# Patient Record
Sex: Male | Born: 1990 | Hispanic: Yes | Marital: Single
Health system: Southern US, Community
[De-identification: ages and names within clinical notes are randomized; demographics above are authoritative.]

---

## 2022-04-21 ENCOUNTER — Emergency Department (HOSPITAL_COMMUNITY): Payer: Managed Care, Other (non HMO)

## 2022-04-21 ENCOUNTER — Encounter (HOSPITAL_COMMUNITY): Payer: Self-pay

## 2022-04-21 ENCOUNTER — Other Ambulatory Visit: Payer: Self-pay

## 2022-04-21 ENCOUNTER — Emergency Department (HOSPITAL_COMMUNITY)
Admission: EM | Admit: 2022-04-21 | Discharge: 2022-04-21 | Disposition: A | Discharge status: Home / Self Care | Payer: Managed Care, Other (non HMO) | Attending: Emergency Medicine | Admitting: Emergency Medicine

## 2022-04-21 DIAGNOSIS — R202 Paresthesia of skin: Secondary | ICD-10-CM | POA: Diagnosis not present

## 2022-04-21 DIAGNOSIS — R030 Elevated blood-pressure reading, without diagnosis of hypertension: Secondary | ICD-10-CM

## 2022-04-21 DIAGNOSIS — R519 Headache, unspecified: Secondary | ICD-10-CM | POA: Diagnosis present

## 2022-04-21 DIAGNOSIS — I10 Essential (primary) hypertension: Secondary | ICD-10-CM | POA: Insufficient documentation

## 2022-04-21 DIAGNOSIS — R072 Precordial pain: Secondary | ICD-10-CM

## 2022-04-21 LAB — TROPONIN I (HIGH SENSITIVITY)
Troponin I (High Sensitivity): 3 ng/L (ref <18)
Troponin I (High Sensitivity): 3 ng/L (ref <18)

## 2022-04-21 LAB — BASIC METABOLIC PANEL
Anion gap: 5 (ref 5–15)
BUN: 16 mg/dL (ref 6–20)
CO2: 28 mmol/L (ref 22–32)
Calcium: 9.4 mg/dL (ref 8.9–10.3)
Chloride: 109 mmol/L (ref 98–111)
Creatinine, Ser: 0.94 mg/dL (ref 0.61–1.24)
GFR, Estimated: >60 mL/min (ref >60)
Glucose, Bld: 95 mg/dL (ref 70–99)
Potassium: 4.3 mmol/L (ref 3.5–5.1)
Sodium: 142 mmol/L (ref 135–145)

## 2022-04-21 LAB — CBC
HCT: 46 % (ref 39.0–52.0)
Hemoglobin: 16.1 g/dL (ref 13.0–17.0)
MCH: 31 pg (ref 26.0–34.0)
MCHC: 35 g/dL (ref 30.0–36.0)
MCV: 88.5 fL (ref 80.0–100.0)
Platelets: 211 10*3/uL (ref 150–400)
RBC: 5.2 MIL/uL (ref 4.22–5.81)
RDW: 11.7 % (ref 11.5–15.5)
WBC: 8.6 10*3/uL (ref 4.0–10.5)
nRBC: 0 % (ref 0.0–0.2)

## 2022-04-21 NOTE — ED Triage Notes (Signed)
Pt states that he has been recording his blood pressure for 2 weeks and today he started having chest pain, headaches, and hand tingling, along with an elevated blood pressure. Pt reports being stressed out recently due to not having a vehicle.

## 2022-04-21 NOTE — Discharge Instructions (Signed)
Go to     [email or web address]    to find a primary care provider  Return for new or worsening symptoms

## 2022-04-21 NOTE — ED Provider Triage Note (Signed)
Emergency Medicine Provider Triage Evaluation Note  Jesse Hooper , a 31 y.o. male  was evaluated in triage.  Pt complains of elevated blood pressure.  States he has been monitoring his blood pressure over the last 2 weeks, as his family has history of hypertension.  He has no formal diagnosis of hypertension.  States this morning he developed some mild chest tightness, tingling all over, headache.  No vision changes.  No back pain, weakness, facial droop.  He does not have any chest pain performing his ADLs at baseline.  No lower extremity swelling, pain, history of PE or DVT  Review of Systems  Positive: Headache, chest pain, elevated blood pressure Negative: Vision changes, weakness, facial droop, slurred speech  Physical Exam  BP 140/89   Pulse 62   Temp 97.9 F (36.6 C) (Oral)   Resp 18   Ht 5\' 10"  (1.778 m)   Wt 75.8 kg   SpO2 100%   BMI 23.96 kg/m  Gen:   Awake, no distress   Resp:  Normal effort  MSK:   Moves extremities without difficulty  Neuro:  Cn 2-12 grossly intact, equal strength, intact sensation Other:    Medical Decision Making  Medically screening exam initiated at 7:58 PM.  Appropriate orders placed.  Larron Ernzen was informed that the remainder of the evaluation will be completed by another provider, this initial triage assessment does not replace that evaluation, and the importance of remaining in the ED until their evaluation is complete.  Elevated BP   Nargis Abrams A, PA-C 04/21/22 2000

## 2022-04-21 NOTE — ED Provider Notes (Signed)
[facility]   CSN: [id number] Arrival date & time: 04/21/22  1912     History  Chief Complaint  Patient presents with   Chest Pain    Jesse ErikssonRoberto Espinosa Hooper is a 31 y.o. male with past medical here for evaluation of elevated blood pressure.  States he is not monitoring his blood pressure over the last 2 weeks.  Has had intermittent elevation.  States he did this because his family members were recently diagnosed with hypertension.  He does not have a PCP.  No formal diagnosis of hypertension.  States he has had some intermittent headaches.  No vision changes, weakness, difficulty word finding.  Patient states he was anxious earlier today developed chest tightness and tingling all over.  Symptoms currently resolved.  He currently has no pain.  Does not get chest pain performing his typical ADLs.  No PND, orthopnea, lower extremity swelling.  No history of PE or DVT.  No illicit substance use.  HPI     Home Medications Prior to Admission medications   Not on File      Allergies    Aspirin    Review of Systems   Review of Systems  Constitutional: Negative.   HENT: Negative.    Respiratory: Negative.    Cardiovascular:  Positive for chest pain. Negative for palpitations and leg swelling.  Gastrointestinal: Negative.   Genitourinary: Negative.   Musculoskeletal: Negative.   Skin: Negative.   Neurological:  Positive for headaches. Negative for dizziness, tremors, seizures, syncope, facial asymmetry, speech difficulty, light-headedness and numbness.  All other systems reviewed and are negative.  Physical Exam Updated Vital Signs BP (!) 129/98   Pulse (!) 58   Temp 97.9 F (36.6 C) (Oral)   Resp (!) 21   Ht 5\' 10"  (1.778 m)   Wt 75.8 kg   SpO2 100%   BMI 23.96 kg/m  Physical Exam Vitals and nursing note reviewed.  Constitutional:      General: He is not in acute distress.    Appearance: He is well-developed. He is not  ill-appearing, toxic-appearing or diaphoretic.  HENT:     Head: Normocephalic and atraumatic.  Eyes:     Pupils: Pupils are equal, round, and reactive to light.  Cardiovascular:     Rate and Rhythm: Normal rate and regular rhythm.     Pulses:          Radial pulses are 2+ on the right side and 2+ on the left side.       Dorsalis pedis pulses are 2+ on the right side and 2+ on the left side.     Heart sounds: Normal heart sounds.  Pulmonary:     Effort: Pulmonary effort is normal. No respiratory distress.     Breath sounds: Normal breath sounds.  Abdominal:     General: Bowel sounds are normal. There is no distension.     Palpations: Abdomen is soft.  Musculoskeletal:        General: Normal range of motion.     Cervical back: Normal range of motion and neck supple.     Right lower leg: No tenderness. No edema.     Left lower leg: No tenderness. No edema.  Skin:    General: Skin is warm and dry.  Neurological:     General: No focal deficit present.     Mental Status: He is alert and oriented to person, place, and time.     Cranial Nerves:  No cranial nerve deficit.     Sensory: Sensation is intact.     Motor: Motor function is intact. No weakness.     Coordination: Coordination is intact.     Gait: Gait is intact.     Comments: Cn 2-12 grossly intact Equal strength Intact sensation Ambulatory without ataxic gait    ED Results / Procedures / Treatments   Labs (all labs ordered are listed, but only abnormal results are displayed) Labs Reviewed  BASIC METABOLIC PANEL  CBC  TROPONIN I (HIGH SENSITIVITY)  TROPONIN I (HIGH SENSITIVITY)    EKG EKG Interpretation  Date/Time:  Tuesday April 21 2022 20:18:30 EDT Ventricular Rate:  52 PR Interval:  165 QRS Duration: 103 QT Interval:  399 QTC Calculation: 371 R Axis:   72 Text Interpretation: Sinus rhythm ST elevation suggests acute pericarditis since last tracing no significant change Confirmed by Mancel Bale [phone]) on  04/21/2022 11:04:59 PM  Radiology DG Chest 2 View  Result Date: 04/21/2022 CLINICAL DATA:  Chest pain EXAM: CHEST - 2 VIEW COMPARISON:  None Available. FINDINGS: Cardiac and mediastinal contours are within normal limits. No focal pulmonary opacity. No pleural effusion or pneumothorax. No acute osseous abnormality. IMPRESSION: No acute cardiopulmonary process. Electronically Signed   By: Wiliam Ke M.D.   On: 04/21/2022 19:53   CT HEAD WO CONTRAST ( )  Result Date: 04/21/2022 CLINICAL DATA:  Chest pain, headache, high blood pressure EXAM: CT HEAD WITHOUT CONTRAST TECHNIQUE: Contiguous axial images were obtained from the base of the skull through the vertex without intravenous contrast. RADIATION DOSE REDUCTION: This exam was performed according to the departmental dose-optimization program which includes automated exposure control, adjustment of the mA and/or kV according to patient size and/or use of iterative reconstruction technique. COMPARISON:  None Available. FINDINGS: Brain: No evidence of acute infarction, hemorrhage, cerebral edema, mass, mass effect, or midline shift. No hydrocephalus or extra-axial fluid collection. Vascular: No hyperdense vessel. Skull: Normal. Negative for fracture or focal lesion. Sinuses/Orbits: No acute finding. Other: The mastoid air cells are well aerated. IMPRESSION: No acute intracranial process. Electronically Signed   By: Wiliam Ke M.D.   On: 04/21/2022 19:59    Procedures Procedures    Medications Ordered in ED Medications - No data to display  ED Course/ Medical Decision Making/ A&P    31 year old no chronic medical problems here for evaluation of elevated blood pressure.  Has been checking his blood pressure at home due to family members recently being diagnosed with hypertension.  States few days ago he had a general aching headache.  None currently.  No head trauma.  He has a nonfocal neuro exam without deficits.  Patient states earlier today he  got worked up and developed some chest tightness and tingling all over.  Resolved prior to arrival.  Does not typically get chest pain with exertion.  Does not appear grossly fluid overloaded.  He is PERC negative, Wells criteria low risk.  Labs and imaging personally viewed and interpreted:  CBC without leukocytosis Delta troponin flat Metabolic panel without significant abnormality EKG initially read possible pericarditis however symptoms do not seem consistent with this.  No STEMI DG chest without any cardiomegaly, pulm edema, infiltrate, pneumothorax CT head without significant normality  Patient reassessed.  Continues to be symptom-free.  Low suspicion for acute ACS, PE, dissection, CVA, hypertensive urgency or emergency.  Sx do not seem consistent myocarditis, pericarditis.  Discussed keeping log of blood pressures at home as well as following up with PCP.  He was given resources for both.  Encouraged return for new or worsening symptoms.  The patient has been appropriately medically screened and/or stabilized in the ED. I have low suspicion for any other emergent medical condition which would require further screening, evaluation or treatment in the ED or require inpatient management.  Patient is hemodynamically stable and in no acute distress.  Patient able to ambulate in department prior to ED.  Evaluation does not show acute pathology that would require ongoing or additional emergent interventions while in the emergency department or further inpatient treatment.  I have discussed the diagnosis with the patient and answered all questions.  Pain is been managed while in the emergency department and patient has no further complaints prior to discharge.  Patient is comfortable with plan discussed in room and is stable for discharge at this time.  I have discussed strict return precautions for returning to the emergency department.  Patient was encouraged to follow-up with PCP/specialist refer  to at discharge.                            Medical Decision Making Amount and/or Complexity of Data Reviewed External Data Reviewed: labs, radiology, ECG and notes. Labs: ordered. Decision-making details documented in ED Course. Radiology: ordered and independent interpretation performed. Decision-making details documented in ED Course. ECG/medicine tests: ordered and independent interpretation performed. Decision-making details documented in ED Course.  Risk OTC drugs. Prescription drug management. Parenteral controlled substances. Diagnosis or treatment significantly limited by social determinants of health.          Final Clinical Impression(s) / ED Diagnoses Final diagnoses:  Elevated blood pressure reading  Precordial pain  Acute nonintractable headache, unspecified headache type    Rx / DC Orders ED Discharge Orders     None         Ariza Evans A, PA-C 04/21/22 2307    Mancel Bale, MD 04/22/22 1100

## 2022-07-27 IMAGING — CR DG CHEST 2V
2 series · 2 of 2 positions shown · non-contrast
Comparison: None Available.

CLINICAL DATA: Chest pain

EXAM:
CHEST - 2 VIEW

[w chest pa]
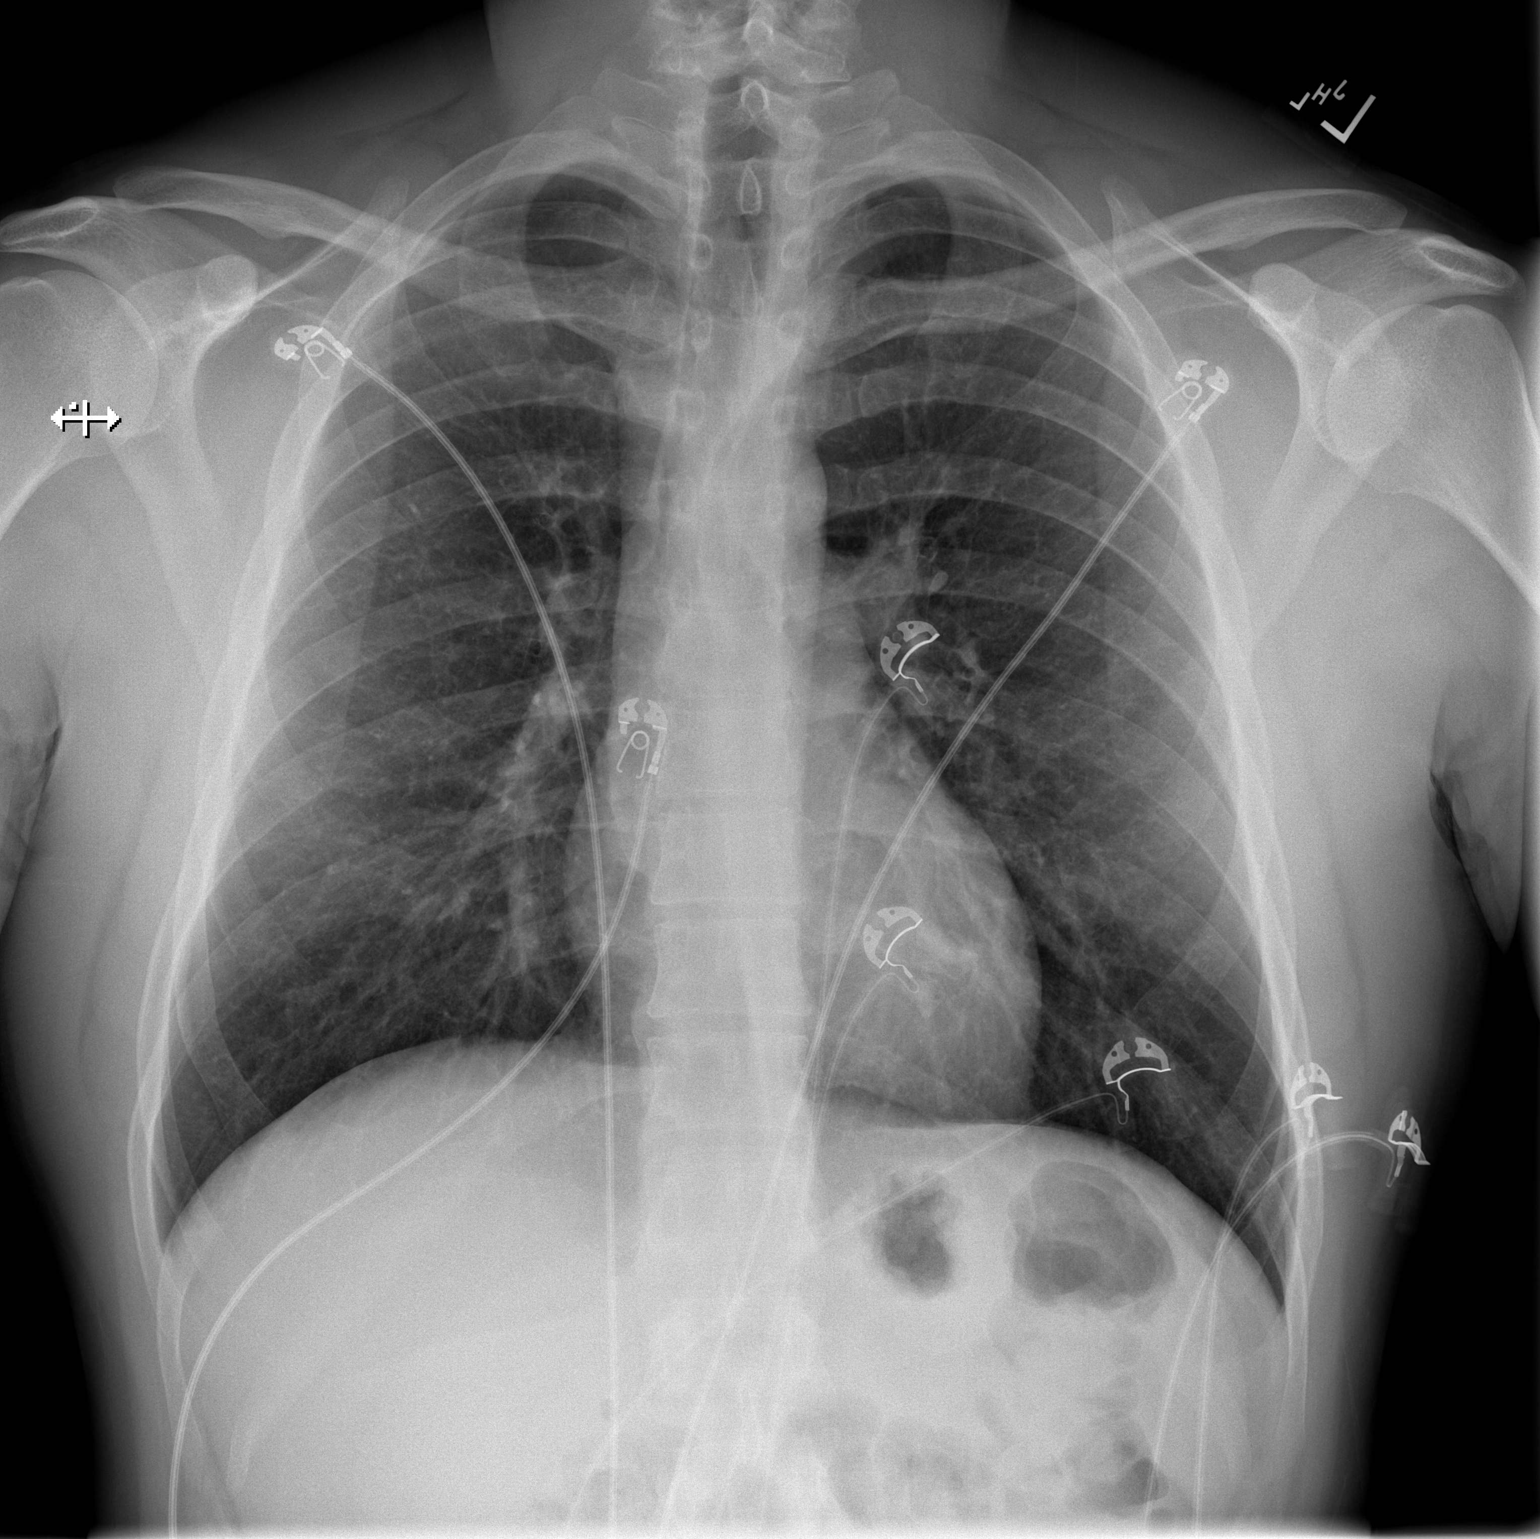

[w chest lat]
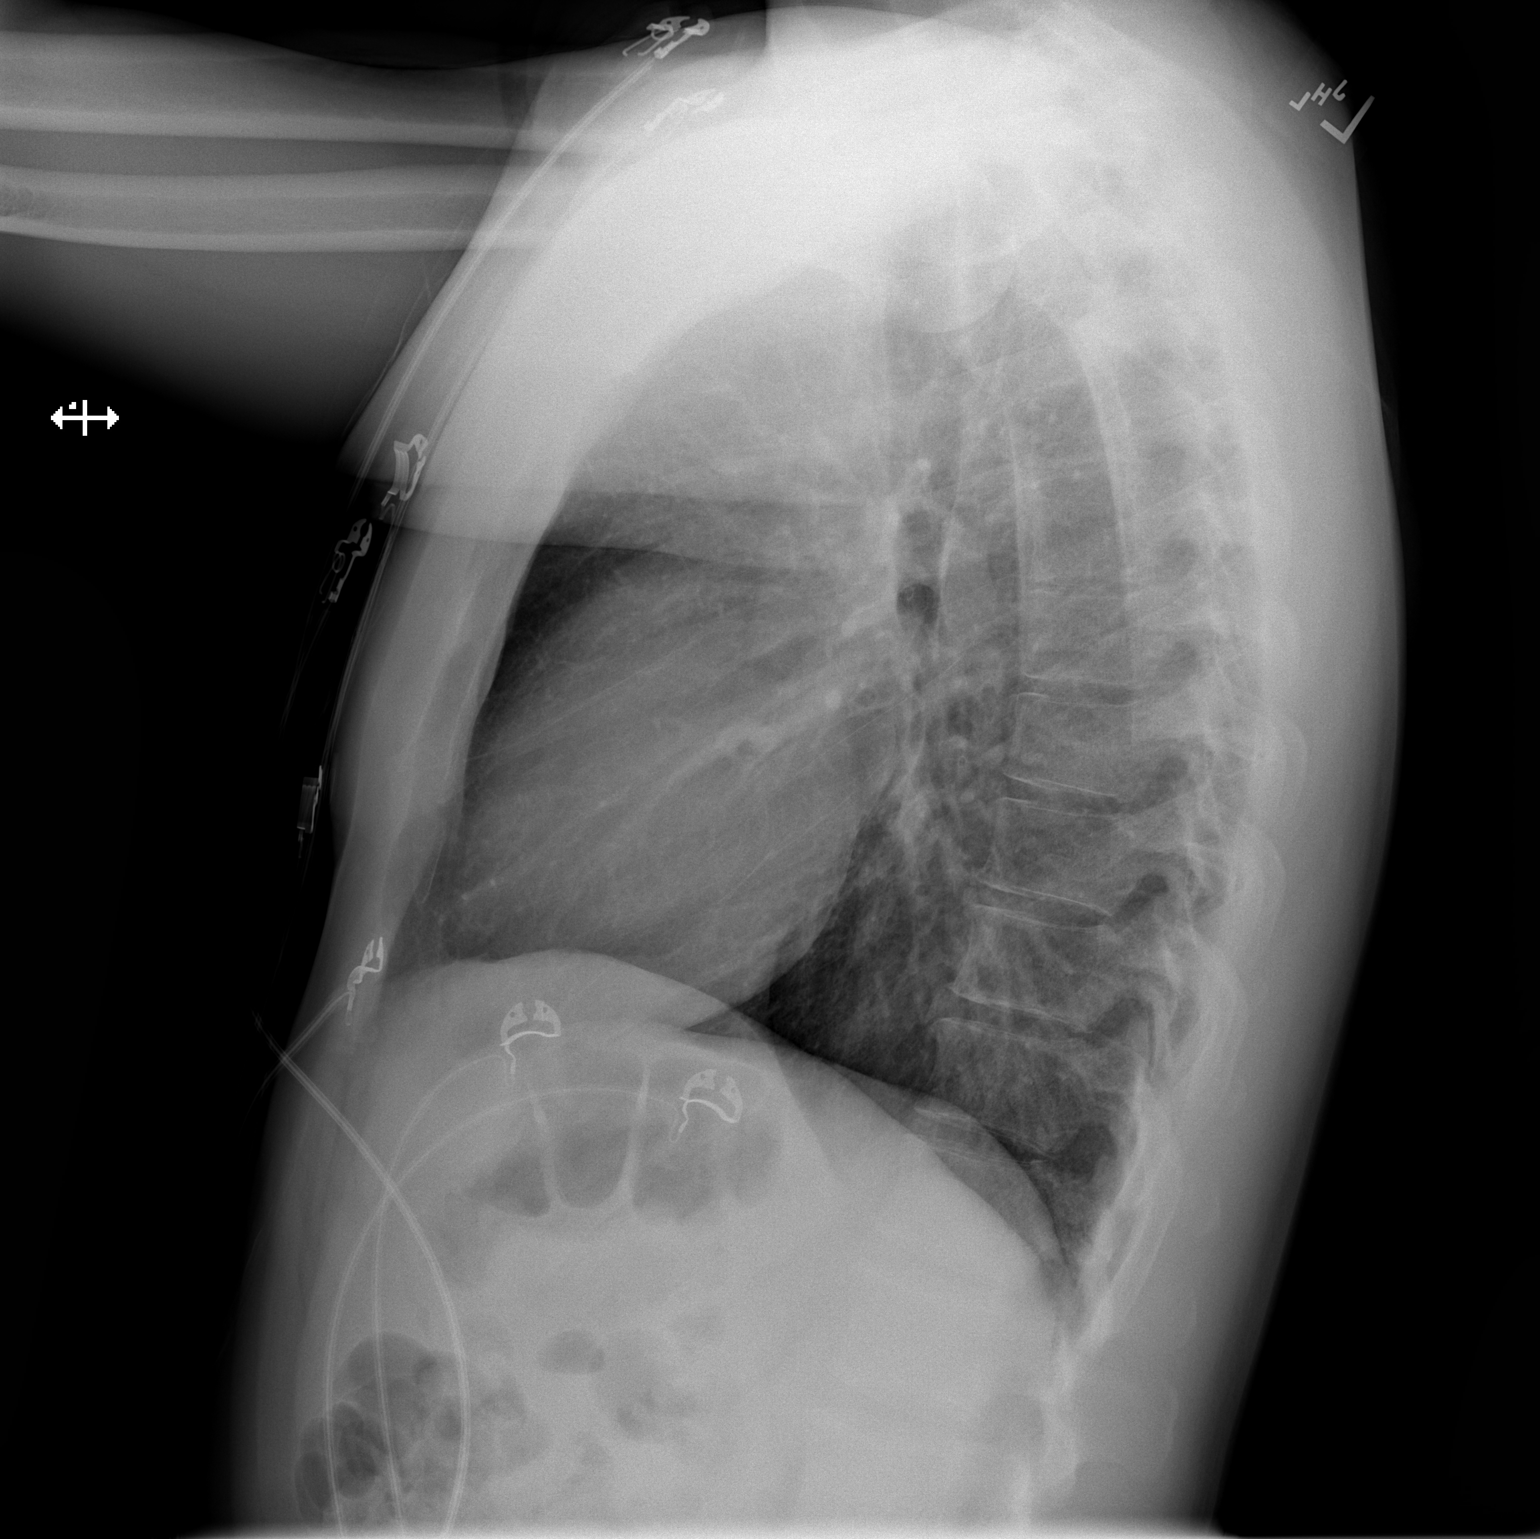

[2 of 2 positions shown; findings below may reference images not displayed]

FINDINGS: Cardiac and mediastinal contours are within normal limits. No focal
pulmonary opacity. No pleural effusion or pneumothorax. No acute
osseous abnormality.
IMPRESSION: No acute cardiopulmonary process.

## 2022-07-27 IMAGING — CT CT HEAD W/O CM
3 series · 16 of 47 positions shown, 19 images · non-contrast
Comparison: None Available.

CLINICAL DATA: Chest pain, headache, high blood pressure



[Series 2: head wo · axial · 0.43mm/px · z∈[+728,+863]mm · 10 of 33 slices shown, 13 images]
[im 3/33  brain]
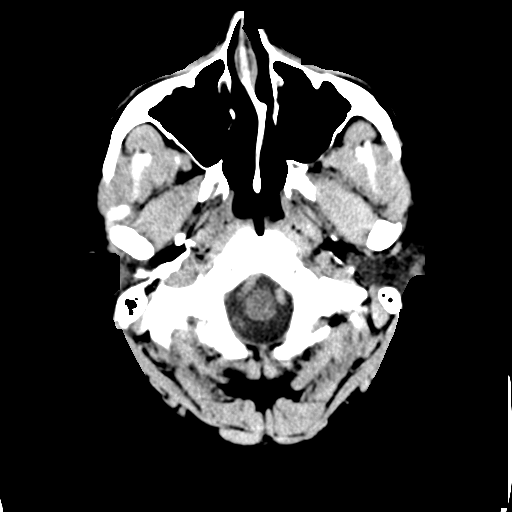
[im 3/33  bone]
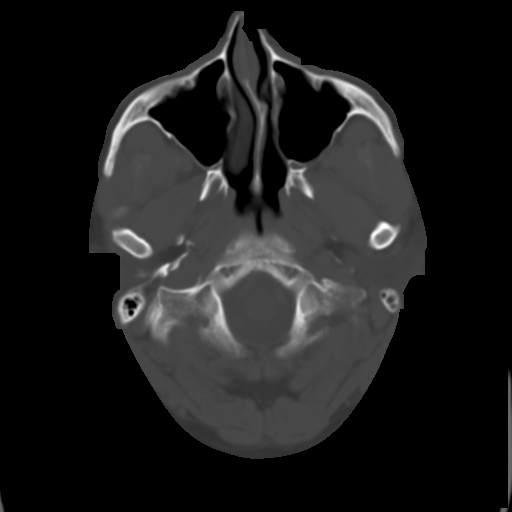
[im 6/33  brain]
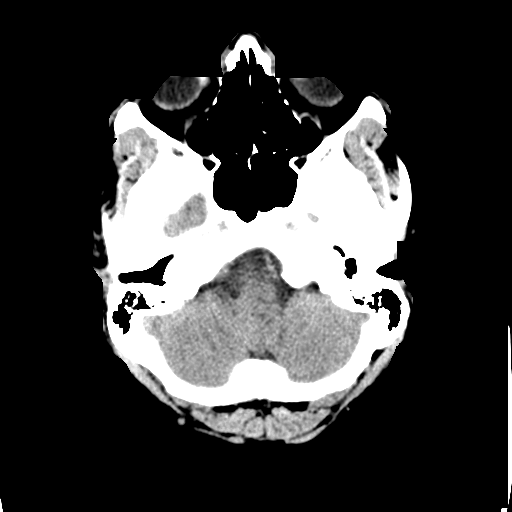
[im 9/33  brain]
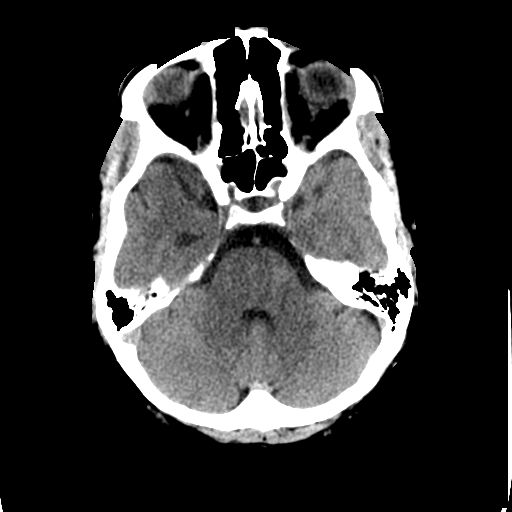
[im 12/33  brain]
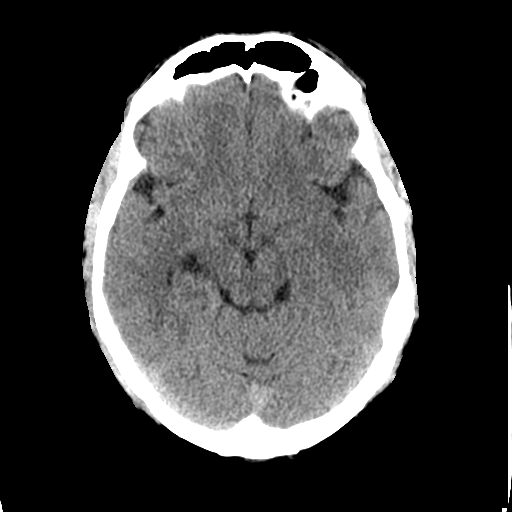
[im 15/33  brain]
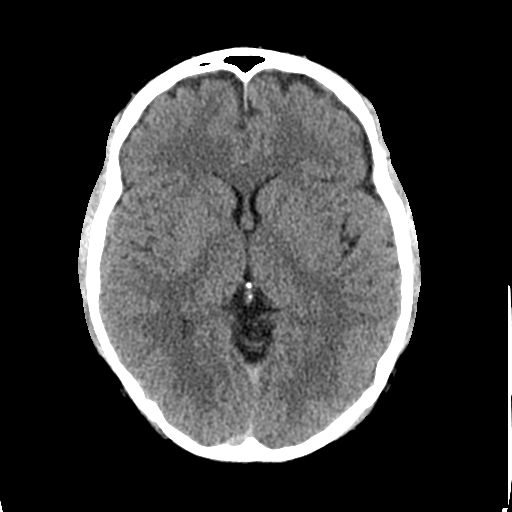
[im 15/33  bone]
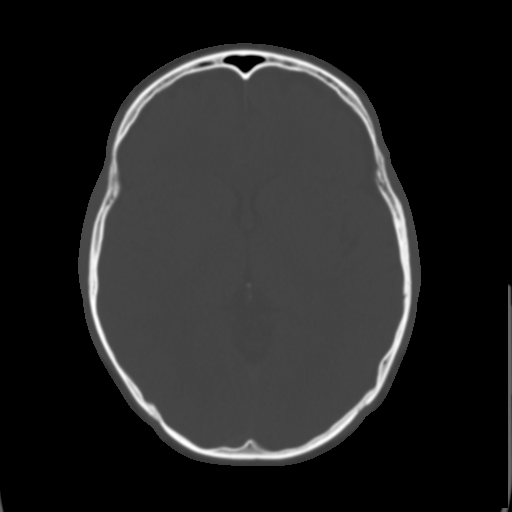
[im 18/33  brain]
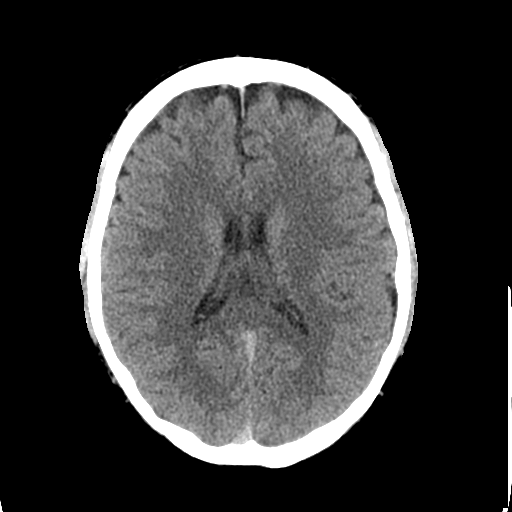
[im 21/33  brain]
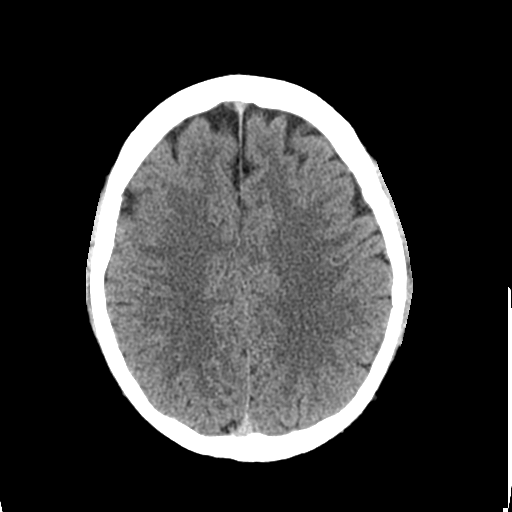
[im 25/33  brain]
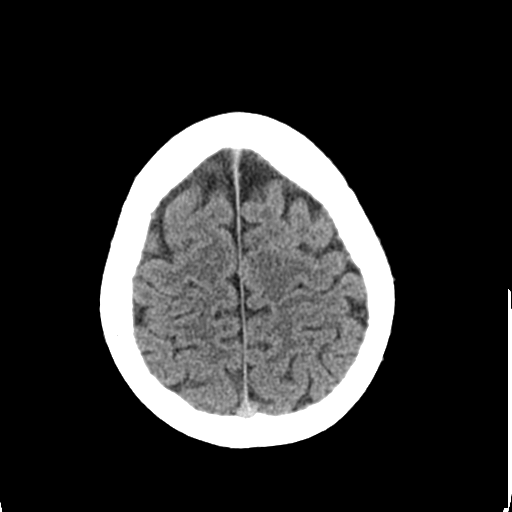
[im 27/33  brain]
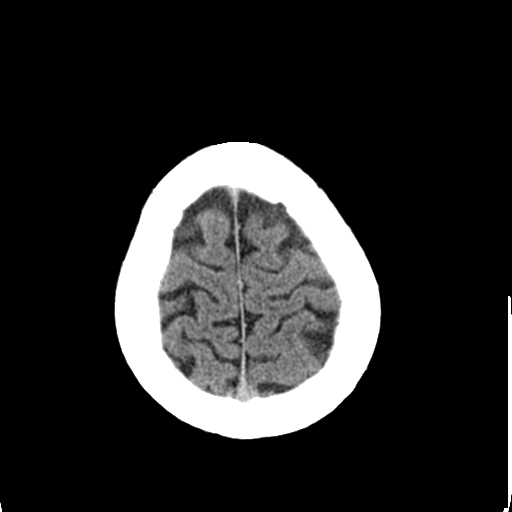
[im 27/33  bone]
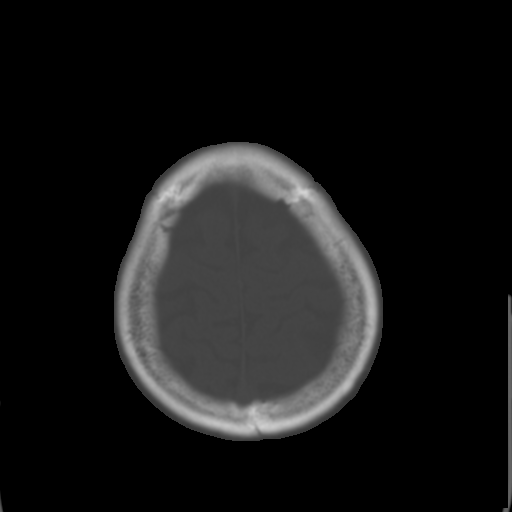
[im 30/33  brain]
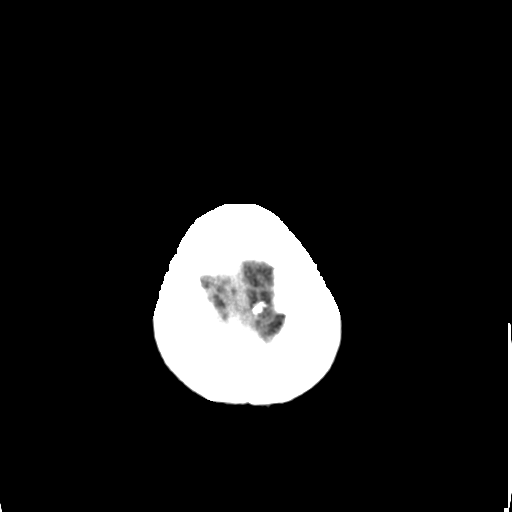

[Series 5: coronal soft tissue · coronal · 0.32mm/px · 3 of 69 slices shown]
[im 23/69  brain]
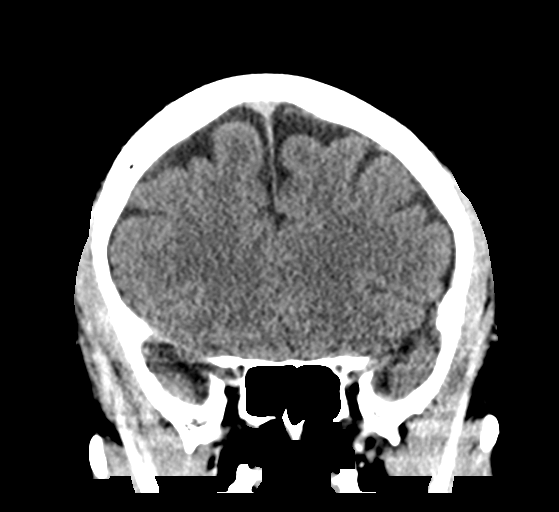
[im 31/69  brain]
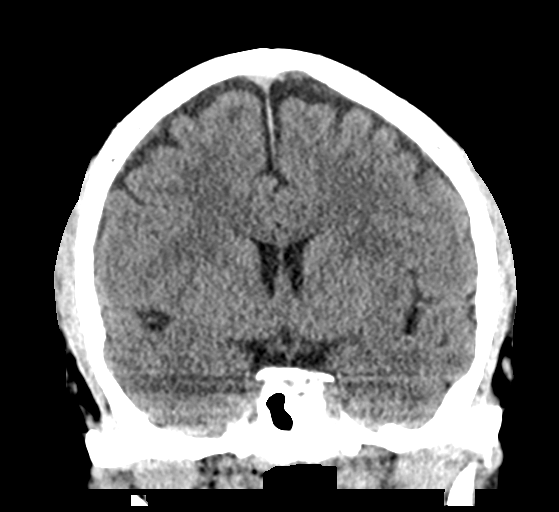
[im 38/69  brain]
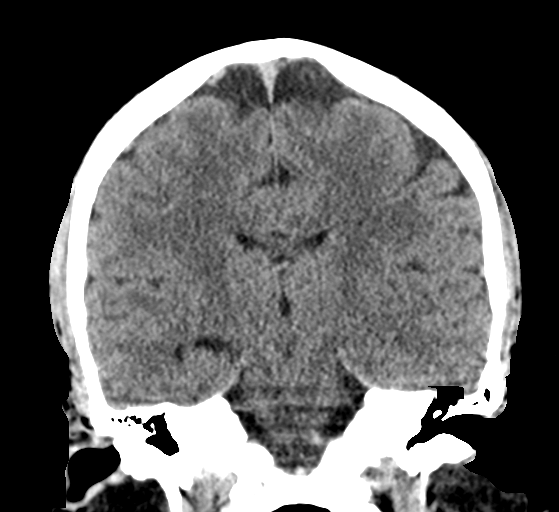

[Series 6: sagittal soft tissue · sagittal · 0.32mm/px · 3 of 61 slices shown]
[im 21/61  brain]
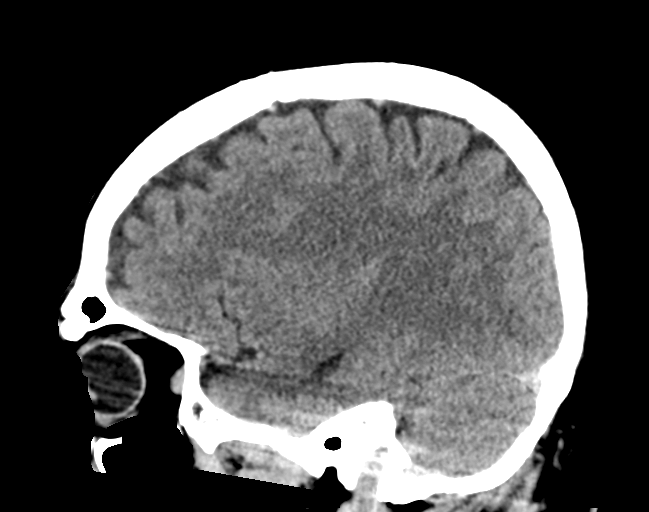
[im 31/61  brain]
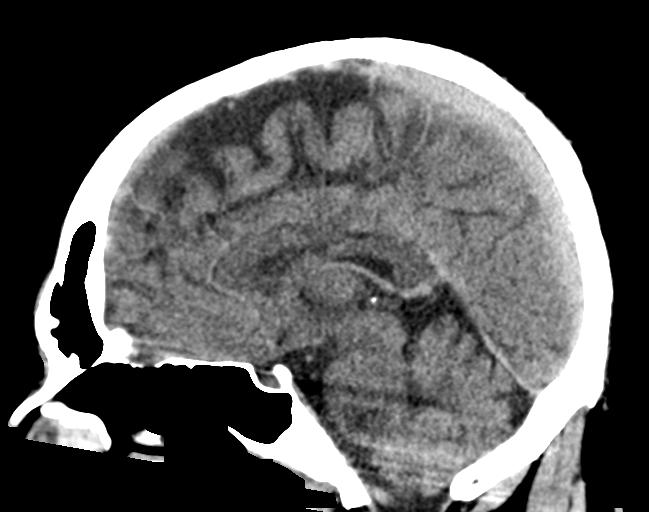
[im 41/61  brain]
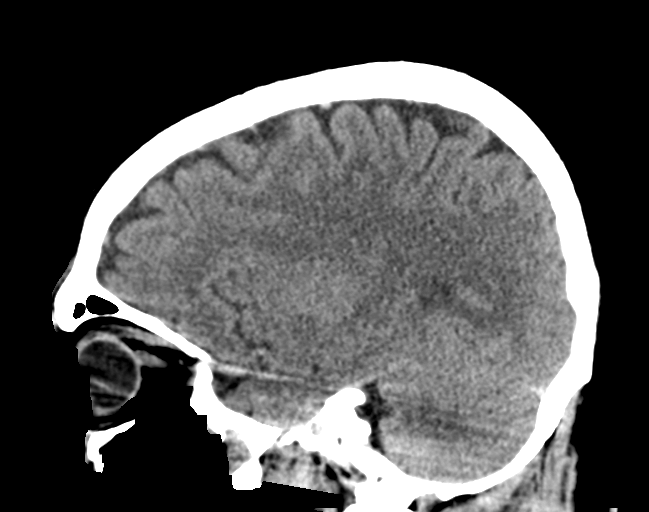

[16 of 47 positions shown; findings below may reference images not displayed]

FINDINGS: Brain: No evidence of acute infarction, hemorrhage, cerebral edema,
mass, mass effect, or midline shift. No hydrocephalus or extra-axial
fluid collection.

Vascular: No hyperdense vessel.

Skull: Normal. Negative for fracture or focal lesion.

Sinuses/Orbits: No acute finding.

Other: The mastoid air cells are well aerated.
IMPRESSION: No acute intracranial process.
